# Patient Record
Sex: Female | Born: 1955
Health system: Southern US, Community
[De-identification: ages and names within clinical notes are randomized; demographics above are authoritative.]

## PROBLEM LIST (undated history)

## (undated) DIAGNOSIS — T7840XA Allergy, unspecified, initial encounter: Secondary | ICD-10-CM

## (undated) DIAGNOSIS — E785 Hyperlipidemia, unspecified: Secondary | ICD-10-CM

## (undated) DIAGNOSIS — E079 Disorder of thyroid, unspecified: Secondary | ICD-10-CM

## (undated) HISTORY — DX: Disorder of thyroid, unspecified: E07.9

## (undated) HISTORY — DX: Allergy, unspecified, initial encounter: T78.40XA

## (undated) HISTORY — PX: BACK SURGERY: SHX140

## (undated) HISTORY — PX: SPINE SURGERY: SHX786

## (undated) HISTORY — PX: TUBAL LIGATION: SHX77

## (undated) HISTORY — DX: Hyperlipidemia, unspecified: E78.5

---

## 2004-05-31 ENCOUNTER — Ambulatory Visit (HOSPITAL_COMMUNITY): Admission: RE | Admit: 2004-05-31 | Discharge: 2004-05-31 | Payer: Self-pay | Admitting: Nurse Practitioner

## 2007-08-27 ENCOUNTER — Ambulatory Visit (HOSPITAL_COMMUNITY): Admission: RE | Admit: 2007-08-27 | Discharge: 2007-08-28 | Payer: Self-pay | Admitting: Neurosurgery

## 2010-08-13 NOTE — Op Note (Signed)
NAME:  CARO, BRUNDIDGE              ACCOUNT NO.:  [id number]   MEDICAL RECORD NO.:  [id number]          PATIENT TYPE:  OIB   LOCATION:  3537                         FACILITY:  [facility]   PHYSICIAN:  Danae Orleans. Venetia Maxon, M.D.  DATE OF BIRTH:  [birth year]   DATE OF PROCEDURE:  08/27/2007  DATE OF DISCHARGE:                               OPERATIVE REPORT   PREOPERATIVE DIAGNOSES:  1. Right L5-S1 herniated lumbar disk with spondylosis.  2. Degenerative disk disease.  3. Radiculopathy.   POSTOPERATIVE DIAGNOSES:  1. Right L5-S1 herniated lumbar disk with spondylosis.  2. Degenerative disk disease.  3. Radiculopathy.   PROCEDURE:  Right L5-S1 microdiskectomy with microdissection.   SURGEON:  Danae Orleans. Venetia Maxon, MD   ASSISTANT:  Payton Doughty, MD   ANESTHESIA:  General endotracheal anesthesia.   ESTIMATED BLOOD LOSS:  Minimal.   COMPLICATIONS:  None.   DISPOSITION:  Recovery.   INDICATIONS:  Ariel Gilbert is a 55 year old woman with right L5-S1  disk herniation with significant right S1 radiculopathy.  It was elected  to take her surgery for microdiskectomy.   PROCEDURE:  Ms. Braun was brought to the operating room.  Following  satisfactory uncomplicated induction of general endotracheal anesthesia  and placement of intravenous lines, the patient was placed in a prone  position on the Wilson frame.  Her low back was prepped and draped in  the usual sterile fashion.  Area of planned incision was infiltrated  with 0.25% Marcaine and 0.5% lidocaine with 1:200,000 epinephrine.  Incision was made in the midline overlying the L5-S1 interspace.  Sharply through the right side of the posterior of the lumbodorsal  fascia, this was done sharply.  Subperiosteal dissection was performed  exposing the L5-S1 interspace.  Intraoperative x-ray confirmed the  correct orientation.  Hemi-semi-laminectomy of the L5 was performed high-  speed drill and ligamentum flavum was detached and removed in a  piecemeal fashion.  Foraminotomy was performed overlying S1 nerve root.  The ligamentum flavum was detached and removed with Kerrison rongeurs.  Using microdissection technique, the thecal sac and S1 nerve root were  identified and retracted medially exposing a large amount of herniated  disk material, which was directly compressing the S1 nerve root.  Using  suction retractor, the nerve root was mobilized.  Multiple fragments of  disk material were removed and the thecal sac and S1 nerve root were  widely decompressed.  The lateral recess was also decompressed as this  was the more medial aspect of the canal.  Endplates were not curetted,  but the disk space was cleared of residual loose disk material.  Hemostasis was assured.  The wound was irrigated, bathed in Depo-Medrol  and fentanyl.  Self-retaining retractor was removed.  Lumbodorsal fascia  was closed with 0 Vicryl sutures.  Subcutaneous tissues were  reapproximated with 2-0 Vicryl interrupted inverted sutures.  Skin edges  were reapproximated with interrupted 3-0 Vicryl subcutaneous stitch.  The wound was dressed with Dermabond.  The patient was extubated in the  operating room and taken to the recovery room in stable satisfactory  condition, having tolerated the operation  well.      Danae Orleans. Venetia Maxon, M.D.  Electronically Signed     JDS/MEDQ  D:  08/27/2007  T:  08/28/2007  Job:  [id number]

## 2010-12-25 LAB — CBC
Hemoglobin: 13.3
WBC: 7.4

## 2015-05-01 ENCOUNTER — Other Ambulatory Visit (HOSPITAL_COMMUNITY): Payer: Self-pay | Admitting: Family Medicine

## 2015-05-01 DIAGNOSIS — Z1231 Encounter for screening mammogram for malignant neoplasm of breast: Secondary | ICD-10-CM

## 2015-05-01 DIAGNOSIS — E039 Hypothyroidism, unspecified: Secondary | ICD-10-CM

## 2015-05-07 ENCOUNTER — Ambulatory Visit (HOSPITAL_COMMUNITY)
Admission: RE | Admit: 2015-05-07 | Discharge: 2015-05-07 | Disposition: A | Discharge status: Home / Self Care | Payer: BLUE CROSS/BLUE SHIELD | Source: Ambulatory Visit | Attending: Family Medicine | Admitting: Family Medicine

## 2015-05-07 DIAGNOSIS — Z1231 Encounter for screening mammogram for malignant neoplasm of breast: Secondary | ICD-10-CM | POA: Diagnosis present

## 2015-05-07 DIAGNOSIS — E039 Hypothyroidism, unspecified: Secondary | ICD-10-CM

## 2015-07-03 ENCOUNTER — Encounter: Payer: Self-pay | Admitting: Internal Medicine

## 2015-07-24 DIAGNOSIS — E782 Mixed hyperlipidemia: Secondary | ICD-10-CM | POA: Insufficient documentation

## 2015-07-24 DIAGNOSIS — E8881 Metabolic syndrome: Secondary | ICD-10-CM | POA: Insufficient documentation

## 2015-07-24 DIAGNOSIS — E063 Autoimmune thyroiditis: Principal | ICD-10-CM | POA: Insufficient documentation

## 2015-07-24 DIAGNOSIS — E038 Other specified hypothyroidism: Secondary | ICD-10-CM | POA: Insufficient documentation

## 2016-07-08 LAB — HM PAP SMEAR: HM Pap smear: NEGATIVE

## 2016-11-25 ENCOUNTER — Encounter: Payer: Self-pay | Admitting: Family Medicine

## 2016-11-25 ENCOUNTER — Ambulatory Visit (INDEPENDENT_AMBULATORY_CARE_PROVIDER_SITE_OTHER): Payer: BLUE CROSS/BLUE SHIELD | Admitting: Family Medicine

## 2016-11-25 VITALS — BP 128/86 | HR 72 | Temp 97.8°F | Resp 18 | Ht 66.25 in | Wt 167.0 lb

## 2016-11-25 DIAGNOSIS — E8881 Metabolic syndrome: Secondary | ICD-10-CM

## 2016-11-25 DIAGNOSIS — Z1211 Encounter for screening for malignant neoplasm of colon: Secondary | ICD-10-CM

## 2016-11-25 DIAGNOSIS — Z1239 Encounter for other screening for malignant neoplasm of breast: Secondary | ICD-10-CM

## 2016-11-25 DIAGNOSIS — E063 Autoimmune thyroiditis: Secondary | ICD-10-CM

## 2016-11-25 DIAGNOSIS — Z1231 Encounter for screening mammogram for malignant neoplasm of breast: Secondary | ICD-10-CM | POA: Diagnosis not present

## 2016-11-25 DIAGNOSIS — E782 Mixed hyperlipidemia: Secondary | ICD-10-CM

## 2016-11-25 DIAGNOSIS — E038 Other specified hypothyroidism: Secondary | ICD-10-CM | POA: Diagnosis not present

## 2016-11-25 NOTE — Progress Notes (Signed)
Chief Complaint  Patient presents with  . Follow-up   New patient to establish Is under care endocrinology for hypothyroidism due to hashimoto's thyroiditis. Recent thyroid labs and A1c normal per patient Also has hyperlipidemia Due for lipid panel Is careful with diet and exercise since diagnosed with metabolic syndrome.  Had elevated fasting sugar.  Now limits sweets and carbs. Last mammo 2/17.  Discussed frequency and she prefers to get them yearly PAP done last year Shots up to date per patient - although she cannot recall last tetanus.  Declines booster   Patient Active Problem List   Diagnosis Date Noted  . Mixed hyperlipidemia 07/24/2015  . Metabolic syndrome 07/24/2015  . Hypothyroidism due to Hashimoto's thyroiditis 07/24/2015    Outpatient Encounter Prescriptions as of 11/25/2016  Medication Sig  . levothyroxine (SYNTHROID) 100 MCG tablet One tablet Monday through Saturday and HALF tablet on Sunday.   No facility-administered encounter medications on file as of 11/25/2016.     Past Medical History:  Diagnosis Date  . Allergy    hay fever  . Hyperlipidemia   . Thyroid disease     Past Surgical History:  Procedure Laterality Date  . BACK SURGERY    . SPINE SURGERY     discectomy  . TUBAL LIGATION      Social History   Social History  . Marital status: Married    Spouse name: Ariel Gilbert  . Number of children: 1  . Years of education: 6   Occupational History  . customer service    Social History Main Topics  . Smoking status: Never Smoker  . Smokeless tobacco: Never Used  . Alcohol use No  . Drug use: No  . Sexual activity: Yes    Birth control/ protection: Post-menopausal   Other Topics Concern  . Not on file   Social History Narrative   Lives with husband Ariel Gilbert   Ariel Gilbert and two Ariel Gilbert   Enjoys reading, walking    Family History  Problem Relation Age of Onset  . Cancer Mother        throat  . Alcohol abuse Mother   . Heart  disease Mother   . Hyperlipidemia Mother   . Hypertension Mother   . Alcohol abuse Father   . Diabetes Father   . Heart disease Father   . Hypertension Father   . Hyperlipidemia Father   . Stroke Father 86  . Hypertension Sister   . Diabetes Brother   . Drug abuse Brother        recovering  . Alzheimer's disease Maternal Grandmother   . Cancer Maternal Grandfather   . Alcohol abuse Maternal Grandfather   . Mental illness Paternal Grandmother   . Cancer Paternal Grandfather        brain    Review of Systems  Constitutional: Negative for chills, fever and weight loss.  HENT: Negative for congestion and hearing loss.   Eyes: Negative for blurred vision and pain.  Respiratory: Negative for cough and shortness of breath.   Cardiovascular: Negative for chest pain and leg swelling.  Gastrointestinal: Negative for abdominal pain, constipation, diarrhea and heartburn.  Genitourinary: Negative for dysuria and frequency.  Musculoskeletal: Negative for falls, joint pain and myalgias.  Neurological: Negative for dizziness, seizures and headaches.  Psychiatric/Behavioral: Negative for depression. The patient is not nervous/anxious and does not have insomnia.     BP 128/86 (BP Location: Right Arm, Patient Position: Sitting, Cuff Size: Normal)   Pulse 72   Temp  97.8 F (36.6 C) (Temporal)   Resp 18   Ht 5' 6.25" (1.683 m)   Wt 167 lb (75.8 kg)   SpO2 98%   BMI 26.75 kg/m   Physical Exam  Constitutional: She is oriented to person, place, and time. She appears well-developed and well-nourished.  HENT:  Head: Normocephalic and atraumatic.  Right Ear: External ear normal.  Left Ear: External ear normal.  Mouth/Throat: Oropharynx is clear and moist.  Eyes: Pupils are equal, round, and reactive to light. Conjunctivae are normal.  Neck: Normal range of motion. Neck supple. No thyromegaly present.  Cardiovascular: Normal rate, regular rhythm and normal heart sounds.   Pulmonary/Chest:  Effort normal and breath sounds normal. No respiratory distress.  Abdominal: Soft. Bowel sounds are normal.  Musculoskeletal: Normal range of motion. She exhibits no edema.  Lymphadenopathy:    She has no cervical adenopathy.  Neurological: She is alert and oriented to person, place, and time.  Gait normal  Skin: Skin is warm and dry.  Psychiatric: She has a normal mood and affect. Her behavior is normal. Thought content normal.  Nursing note and vitals reviewed. ASSESSMENT/PLAN:   1. Hypothyroidism due to Hashimoto's thyroiditis  2. Mixed hyperlipidemia - Lipid panel  3. Metabolic syndrome - CBC - COMPLETE METABOLIC PANEL WITH GFR - VITAMIN D 25 Hydroxy (Vit-D Deficiency, Fractures) - Urinalysis, Routine w reflex microscopic  4. Screening for breast cancer ordered - MM Digital Screening; Future  5. Screen for colon cancer Never had a colonoscopy.  Agrees to cologuard - Cologuard   Patient Instructions  Need blood work Continue with the good eating habits and daily exercise I have ordered a mammogram I have ordered a cologuard  Look into getting the shingles shot  See me in Jan for a physical  Need old records Dr Neita Carp     Eustace Moore, MD

## 2016-11-25 NOTE — Patient Instructions (Signed)
Need blood work Continue with the good eating habits and daily exercise I have ordered a mammogram I have ordered a cologuard  Look into getting the shingles shot  See me in Jan for a physical  Need old records Dr Neita Carp

## 2016-11-26 ENCOUNTER — Telehealth: Payer: Self-pay | Admitting: Family Medicine

## 2016-11-26 LAB — URINALYSIS, ROUTINE W REFLEX MICROSCOPIC
BILIRUBIN URINE: NEGATIVE
GLUCOSE, UA: NEGATIVE
HGB URINE DIPSTICK: NEGATIVE
KETONES UR: NEGATIVE
Leukocytes, UA: NEGATIVE
Nitrite: NEGATIVE
PROTEIN: NEGATIVE
Specific Gravity, Urine: 1.01 (ref 1.001–1.035)
pH: 7.5 (ref 5.0–8.0)

## 2016-11-26 LAB — CBC
HCT: 40.6 % (ref 35.0–45.0)
Hemoglobin: 13.5 g/dL (ref 11.7–15.5)
MCH: 30 pg (ref 27.0–33.0)
MCHC: 33.3 g/dL (ref 32.0–36.0)
MCV: 90.2 fL (ref 80.0–100.0)
MPV: 9.7 fL (ref 7.5–12.5)
PLATELETS: 254 10*3/uL (ref 140–400)
RBC: 4.5 MIL/uL (ref 3.80–5.10)
RDW: 12.7 % (ref 11.0–15.0)
WBC: 4.9 10*3/uL (ref 3.8–10.8)

## 2016-11-26 LAB — LIPID PANEL
CHOL/HDL RATIO: 4.4 ratio (ref <5.0)
Cholesterol: 244 mg/dL — ABNORMAL HIGH (ref <200)
HDL: 55 mg/dL (ref >50)
LDL CALC: 158 mg/dL — AB (ref <100)
Triglycerides: 154 mg/dL — ABNORMAL HIGH (ref <150)
VLDL: 31 mg/dL — ABNORMAL HIGH (ref <30)

## 2016-11-26 LAB — COMPLETE METABOLIC PANEL WITH GFR
ALT: 13 U/L (ref 6–29)
AST: 16 U/L (ref 10–35)
Albumin: 4.2 g/dL (ref 3.6–5.1)
Alkaline Phosphatase: 66 U/L (ref 33–130)
BUN: 16 mg/dL (ref 7–25)
CO2: 26 mmol/L (ref 20–32)
Calcium: 8.9 mg/dL (ref 8.6–10.4)
Chloride: 103 mmol/L (ref 98–110)
Creat: 0.8 mg/dL (ref 0.50–1.05)
GFR, EST NON AFRICAN AMERICAN: 82 mL/min (ref >=60)
GFR, Est African American: >89 mL/min (ref >=60)
GLUCOSE: 97 mg/dL (ref 65–99)
POTASSIUM: 4.5 mmol/L (ref 3.5–5.3)
SODIUM: 139 mmol/L (ref 135–146)
TOTAL PROTEIN: 6.6 g/dL (ref 6.1–8.1)
Total Bilirubin: 0.3 mg/dL (ref 0.2–1.2)

## 2016-11-27 ENCOUNTER — Encounter: Payer: Self-pay | Admitting: Family Medicine

## 2016-11-27 LAB — VITAMIN D 25 HYDROXY (VIT D DEFICIENCY, FRACTURES): VIT D 25 HYDROXY: 20 ng/mL — AB (ref 30–100)

## 2016-12-03 ENCOUNTER — Ambulatory Visit (HOSPITAL_COMMUNITY)
Admission: RE | Admit: 2016-12-03 | Discharge: 2016-12-03 | Disposition: A | Discharge status: Home / Self Care | Payer: BLUE CROSS/BLUE SHIELD | Source: Ambulatory Visit | Attending: Family Medicine | Admitting: Family Medicine

## 2016-12-03 DIAGNOSIS — Z1239 Encounter for other screening for malignant neoplasm of breast: Secondary | ICD-10-CM

## 2016-12-03 DIAGNOSIS — Z1231 Encounter for screening mammogram for malignant neoplasm of breast: Secondary | ICD-10-CM | POA: Diagnosis not present

## 2017-02-17 ENCOUNTER — Other Ambulatory Visit: Payer: Self-pay

## 2017-02-17 ENCOUNTER — Ambulatory Visit (INDEPENDENT_AMBULATORY_CARE_PROVIDER_SITE_OTHER): Payer: BLUE CROSS/BLUE SHIELD | Admitting: Family Medicine

## 2017-02-17 ENCOUNTER — Encounter: Payer: Self-pay | Admitting: Family Medicine

## 2017-02-17 VITALS — BP 136/88 | HR 74 | Temp 98.7°F | Resp 16 | Ht 66.0 in | Wt 168.0 lb

## 2017-02-17 DIAGNOSIS — J341 Cyst and mucocele of nose and nasal sinus: Secondary | ICD-10-CM

## 2017-02-17 NOTE — Progress Notes (Signed)
    Chief Complaint  Patient presents with  . Referral    ENT   Patient is here requesting referral to ENT.  She went to her dentist for evaluation and had a panoramic x-ray.  This x-ray revealed a "mass".  She states that the dentist told her not to worry, but that she needed follow-up.  I called and spoke with the dentist.  He tells me that she had complained to him about sinus pressure and pain that was chronic and continuous.  When he did a panoramic view he found that she had what looked like a mucosal inclusion cyst in her maxillary sinus.  He did not see any mass or growth that had, what he considered to be malignant potential.  He does feel an ENT doctor given her sinus symptoms in the mid mucus inclusion cyst is a good idea. Ariel Gilbert tells me that her daughter works [facility] [employer].  She works in the [employer] and knows the physicians available there.  She has specifically recommended that Dr. Jacalyn LefevreJames Browne be consulted.  He is an ENT with her facility.  Patient Active Problem List   Diagnosis Date Noted  . Mixed hyperlipidemia 07/24/2015  . Metabolic syndrome 07/24/2015  . Hypothyroidism due to Hashimoto's thyroiditis 07/24/2015    Outpatient Encounter Medications as of 02/17/2017  Medication Sig  . levothyroxine (SYNTHROID) 100 MCG tablet One tablet Monday through Saturday and HALF tablet on Sunday.   No facility-administered encounter medications on file as of 02/17/2017.     No Known Allergies  Review of Systems  Constitutional: Negative for activity change, appetite change, chills and fever.  HENT: Positive for postnasal drip and sinus pressure. Negative for congestion, dental problem, rhinorrhea, sinus pain and sore throat.        Intermittent per patient  Eyes: Negative for discharge and visual disturbance.  Respiratory: Negative for cough and shortness of breath.   Cardiovascular: Negative for chest pain, palpitations and leg swelling.  Skin:       Patient very  focused on a dimple of skin present on nose and forehead that appear benign  Neurological: Negative for dizziness, facial asymmetry and headaches.     BP 136/88 (BP Location: Right Arm, Patient Position: Sitting, Cuff Size: Normal)   Pulse 74   Temp 98.7 F (37.1 C) (Temporal)   Resp 16   Ht 5\' 6"  (1.676 m)   Wt 168 lb 0.6 oz (76.2 kg)   SpO2 98%   BMI 27.12 kg/m   Physical Exam  Constitutional: She appears well-developed and well-nourished. No distress.  HENT:  Head: Normocephalic and atraumatic.  Right Ear: External ear normal.  Left Ear: External ear normal.  Nose: Nose normal.    Mouth/Throat: Oropharynx is clear and moist.  No sinus tenderness  Cardiovascular: Normal rate, regular rhythm and normal heart sounds.  Pulmonary/Chest: Effort normal and breath sounds normal.    ASSESSMENT/PLAN:  1. Maxillary sinus cyst Discussed with dentist Discussed with patient - Ambulatory referral to ENT   Patient Instructions  I have placed referral to Dr Erroll LunaBrowne Call for problems   Eustace MooreYvonne Sue Nelson, MD

## 2017-02-17 NOTE — Patient Instructions (Signed)
I have placed referral to Dr Erroll LunaBrowne Call for problems

## 2017-04-13 ENCOUNTER — Telehealth: Payer: Self-pay | Admitting: Family Medicine

## 2017-04-13 NOTE — Telephone Encounter (Signed)
Called patient to r/s her cpe per r/s list for 04/27/17

## 2017-04-27 ENCOUNTER — Encounter: Payer: BLUE CROSS/BLUE SHIELD | Admitting: Family Medicine

## 2017-06-01 ENCOUNTER — Encounter: Payer: Self-pay | Admitting: Family Medicine

## 2017-06-01 ENCOUNTER — Other Ambulatory Visit: Payer: Self-pay

## 2017-06-01 ENCOUNTER — Ambulatory Visit (INDEPENDENT_AMBULATORY_CARE_PROVIDER_SITE_OTHER): Payer: BLUE CROSS/BLUE SHIELD | Admitting: Family Medicine

## 2017-06-01 VITALS — BP 138/86 | HR 72 | Temp 98.1°F | Resp 16 | Ht 64.0 in | Wt 167.1 lb

## 2017-06-01 DIAGNOSIS — Z0000 Encounter for general adult medical examination without abnormal findings: Secondary | ICD-10-CM | POA: Diagnosis not present

## 2017-06-01 NOTE — Progress Notes (Signed)
Chief Complaint  Patient presents with  . Annual Exam   Patient is here for her annual physical examination. She had her mammogram and her colon cancer screening both done at 2018.  Her Pap is up-to-date. Immunizations are up-to-date. She has increased her walking and exercise.  She is watching the cholesterol in her diet.  She does have some moderate hyperlipidemia but her Framingham risk of developing heart disease over the next 10 years is only 7%.  She wishes not to take a statin unless she has to. She sees an endocrinologist regularly.  Recently had a TSH that was therapeutic.  She is on a stable dose of levothyroxine.  No symptoms suggestive of thyroid dysfunction, heat or cold intolerance, fatigue, weight gain, dry skin, constipation or diarrhea, palpitations.   Patient Active Problem List   Diagnosis Date Noted  . Mixed hyperlipidemia 07/24/2015  . Metabolic syndrome 07/24/2015  . Hypothyroidism due to Hashimoto's thyroiditis 07/24/2015    Outpatient Encounter Medications as of 06/01/2017  Medication Sig  . levothyroxine (SYNTHROID) 100 MCG tablet One tablet Monday through Saturday and HALF tablet on Sunday.   No facility-administered encounter medications on file as of 06/01/2017.     No Known Allergies  Review of Systems  Constitutional: Negative for activity change, appetite change and unexpected weight change.  HENT: Negative for congestion, dental problem, postnasal drip and rhinorrhea.   Eyes: Negative for redness and visual disturbance.       Gradual decline in visual acuity.  Respiratory: Negative for cough and shortness of breath.   Cardiovascular: Negative for chest pain, palpitations and leg swelling.  Gastrointestinal: Negative for abdominal pain, constipation and diarrhea.  Genitourinary: Negative for difficulty urinating, frequency and vaginal bleeding.  Musculoskeletal: Negative for arthralgias and back pain.  Neurological: Negative for dizziness and  headaches.  Psychiatric/Behavioral: Negative for dysphoric mood and sleep disturbance. The patient is not nervous/anxious.     BP 138/86   Pulse 72   Temp 98.1 F (36.7 C) (Temporal)   Resp 16   Ht 5\' 4"  (1.626 m)   Wt 167 lb 1.9 oz (75.8 kg)   SpO2 100%   BMI 28.69 kg/m   Physical Exam  BP 138/86   Pulse 72   Temp 98.1 F (36.7 C) (Temporal)   Resp 16   Ht 5\' 4"  (1.626 m)   Wt 167 lb 1.9 oz (75.8 kg)   SpO2 100%   BMI 28.69 kg/m   General Appearance:    Alert, cooperative, no distress, appears stated age  Head:    Normocephalic, without obvious abnormality, atraumatic  Eyes:    PERRL, conjunctiva/corneas clear, EOM's intact, fundi    benign, both eyes  Ears:    Normal TM's and external ear canals, both ears  Nose:   Nares normal, septum midline, mucosa normal, no drainage    or sinus tenderness  Throat:   Lips, mucosa, and tongue normal; teeth and gums normal  Neck:   Supple, symmetrical, trachea midline, no adenopathy;    thyroid:  no enlargement/tenderness/nodules; no carotid   bruit or JVD  Back:     Symmetric, no curvature, ROM normal, no CVA tenderness  Lungs:     Clear to auscultation bilaterally, respirations unlabored  Chest Wall:    No tenderness or deformity   Heart:    Regular rate and rhythm, S1 and S2 normal, no murmur, rub   or gallop  Breast Exam:    Defer GYN  Abdomen:  Soft, non-tender, bowel sounds active all four quadrants,    no masses, no organomegaly  Extremities:   Extremities normal, atraumatic, no cyanosis or edema.  Bunion deformity both feet right greater than left.  Nontender  Pulses:   2+ and symmetric all extremities  Skin:   Skin color, texture, turgor normal, no rashes or lesions  Lymph nodes:   Cervical, supraclavicular, and axillary nodes normal  Neurologic:   Nnormal strength, sensation and reflexes    throughout     ASSESSMENT/PLAN:  1. Annual physical exam No unexpected findings.   Patient Instructions  Continue to  watch the cholesterol in your diet. Walk every day that you are able. You are up-to-date with all of your health care. You are due for an eye exam.  You need to be seen yearly for an exam. Call or return sooner for any problems. We will send you a letter with follow-up information.   Eustace Moore, MD

## 2017-06-01 NOTE — Patient Instructions (Signed)
Continue to watch the cholesterol in your diet. Walk every day that you are able. You are up-to-date with all of your health care. You are due for an eye exam.  You need to be seen yearly for an exam. Call or return sooner for any problems. We will send you a letter with follow-up information.

## 2017-09-03 ENCOUNTER — Encounter: Payer: Self-pay | Admitting: Family Medicine

## 2017-09-04 ENCOUNTER — Encounter: Payer: Self-pay | Admitting: Family Medicine

## 2018-06-25 ENCOUNTER — Other Ambulatory Visit (HOSPITAL_COMMUNITY): Payer: Self-pay | Admitting: Physician Assistant

## 2018-06-25 DIAGNOSIS — E2839 Other primary ovarian failure: Secondary | ICD-10-CM

## 2020-02-03 ENCOUNTER — Other Ambulatory Visit (HOSPITAL_COMMUNITY): Payer: Self-pay | Admitting: Physician Assistant

## 2020-02-03 DIAGNOSIS — Z1231 Encounter for screening mammogram for malignant neoplasm of breast: Secondary | ICD-10-CM

## 2020-03-12 ENCOUNTER — Other Ambulatory Visit: Payer: Self-pay

## 2020-03-12 ENCOUNTER — Ambulatory Visit (HOSPITAL_COMMUNITY)
Admission: RE | Admit: 2020-03-12 | Discharge: 2020-03-12 | Disposition: A | Discharge status: Home / Self Care | Payer: BC Managed Care – PPO | Source: Ambulatory Visit | Attending: Physician Assistant | Admitting: Physician Assistant

## 2020-03-12 DIAGNOSIS — Z1231 Encounter for screening mammogram for malignant neoplasm of breast: Secondary | ICD-10-CM | POA: Diagnosis not present

## 2021-05-16 ENCOUNTER — Other Ambulatory Visit (HOSPITAL_COMMUNITY): Payer: Self-pay | Admitting: Physician Assistant

## 2021-05-16 DIAGNOSIS — Z1231 Encounter for screening mammogram for malignant neoplasm of breast: Secondary | ICD-10-CM

## 2021-05-22 ENCOUNTER — Ambulatory Visit (HOSPITAL_COMMUNITY)
Admission: RE | Admit: 2021-05-22 | Discharge: 2021-05-22 | Disposition: A | Discharge status: Home / Self Care | Payer: BC Managed Care – PPO | Source: Ambulatory Visit | Attending: Physician Assistant | Admitting: Physician Assistant

## 2021-05-22 ENCOUNTER — Other Ambulatory Visit: Payer: Self-pay

## 2021-05-22 ENCOUNTER — Other Ambulatory Visit (HOSPITAL_COMMUNITY): Payer: Self-pay | Admitting: Physician Assistant

## 2021-05-22 DIAGNOSIS — Z1231 Encounter for screening mammogram for malignant neoplasm of breast: Secondary | ICD-10-CM | POA: Diagnosis not present

## 2021-05-22 DIAGNOSIS — E2839 Other primary ovarian failure: Secondary | ICD-10-CM

## 2021-06-09 LAB — COLOGUARD: COLOGUARD: NEGATIVE

## 2021-06-10 ENCOUNTER — Ambulatory Visit (HOSPITAL_COMMUNITY)
Admission: RE | Admit: 2021-06-10 | Discharge: 2021-06-10 | Disposition: A | Discharge status: Home / Self Care | Payer: BC Managed Care – PPO | Source: Ambulatory Visit | Attending: Physician Assistant | Admitting: Physician Assistant

## 2021-06-10 ENCOUNTER — Other Ambulatory Visit: Payer: Self-pay

## 2021-06-10 DIAGNOSIS — E2839 Other primary ovarian failure: Secondary | ICD-10-CM | POA: Diagnosis present

## 2022-03-17 IMAGING — MG DIGITAL SCREENING BILAT W/ TOMO W/ CAD
8 series · 8 of 24 positions shown · non-contrast
Comparison: Previous exam(s).

CLINICAL DATA: Screening.

EXAM:
DIGITAL SCREENING BILATERAL MAMMOGRAM WITH TOMO AND CAD

[R CC synth-2D]
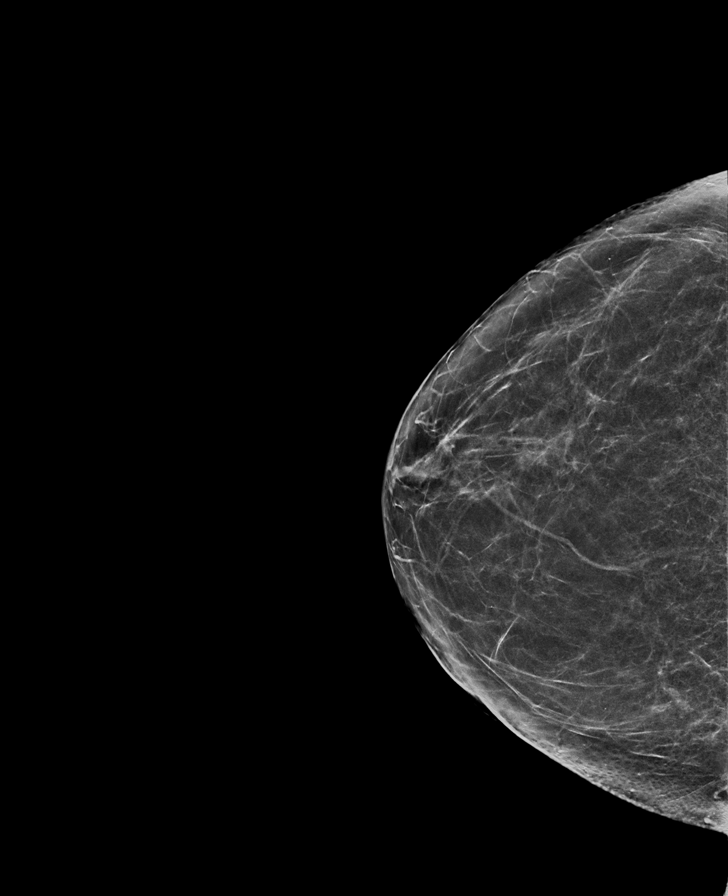

[L CC synth-2D]
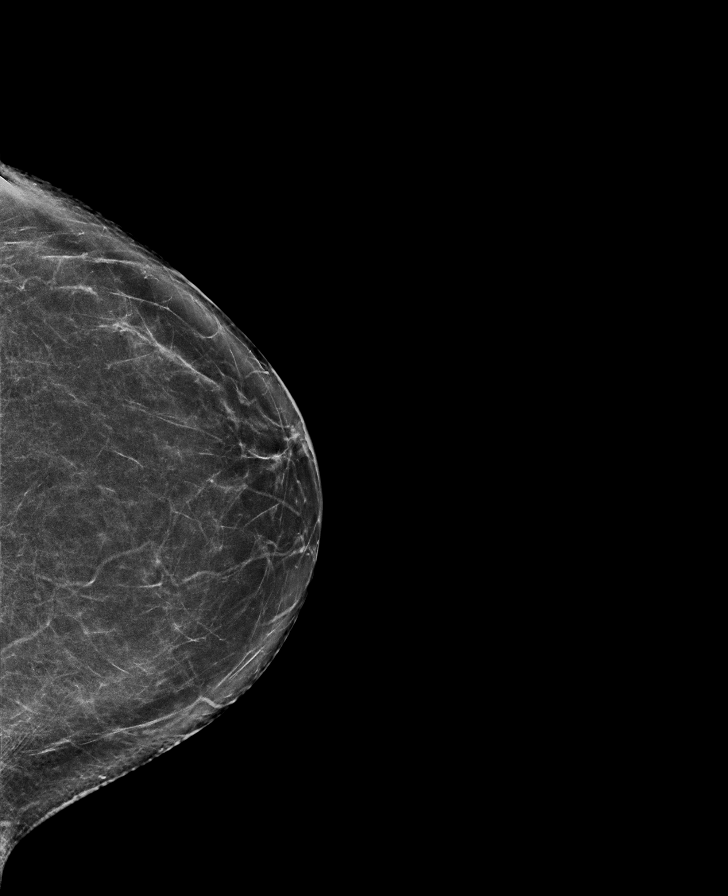

[R MLO synth-2D]
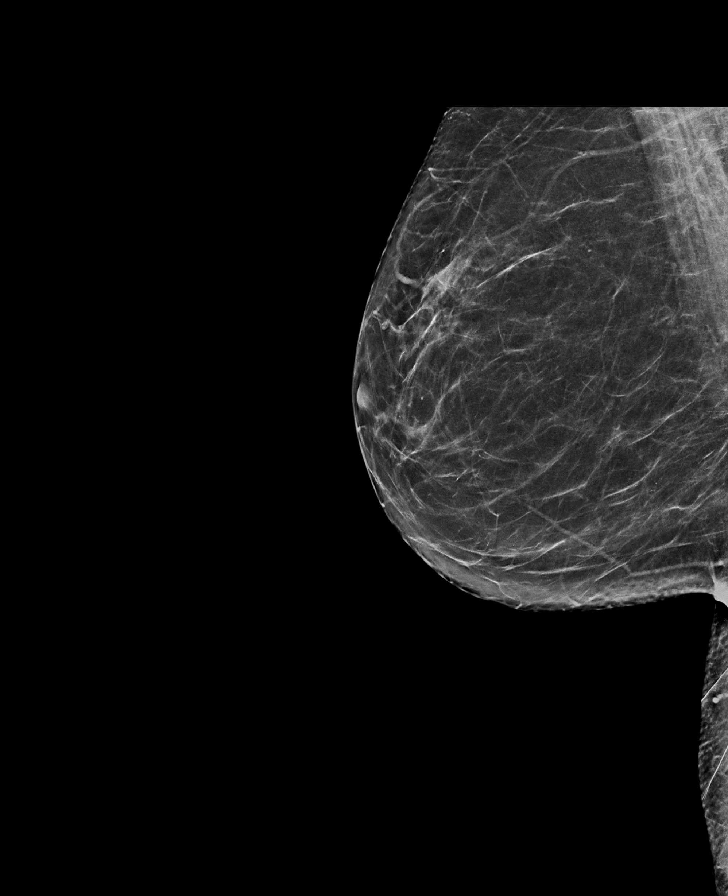

[L MLO synth-2D]
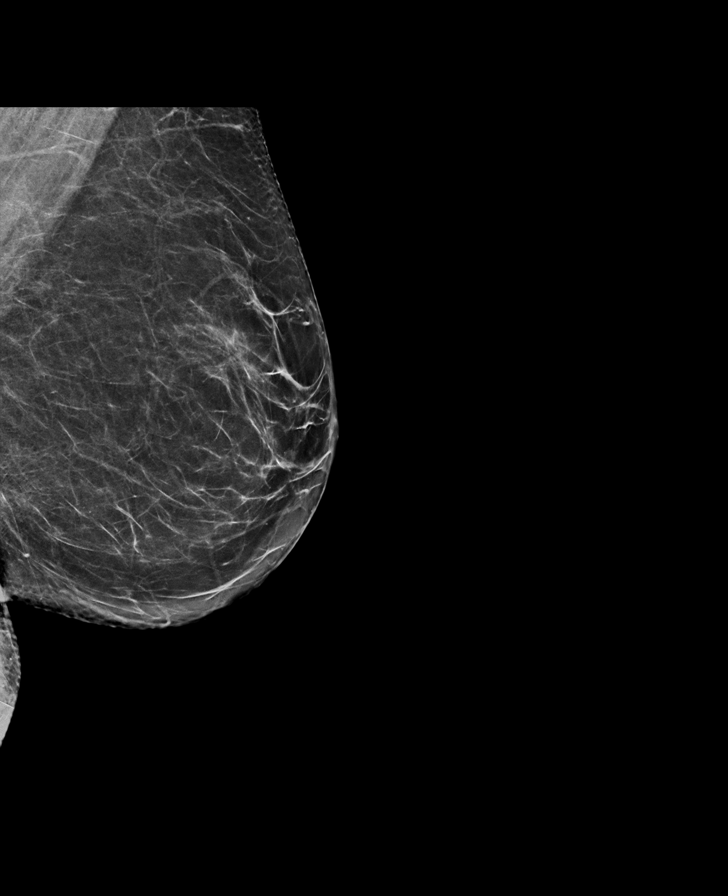

[R MLO tomo · tomo slice 35/68.0]
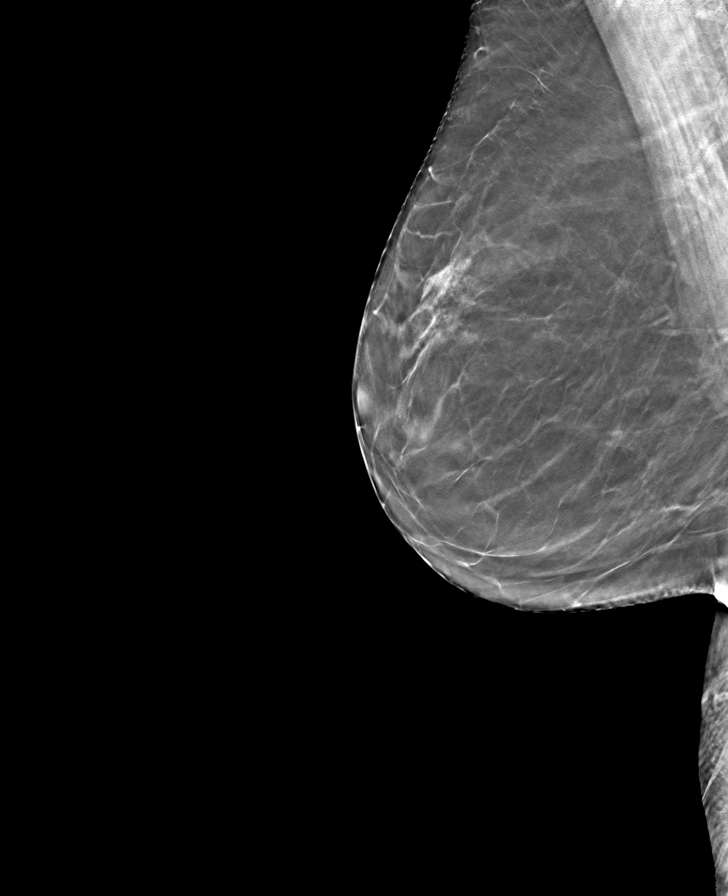

[R CC tomo · tomo slice 33/65.0]
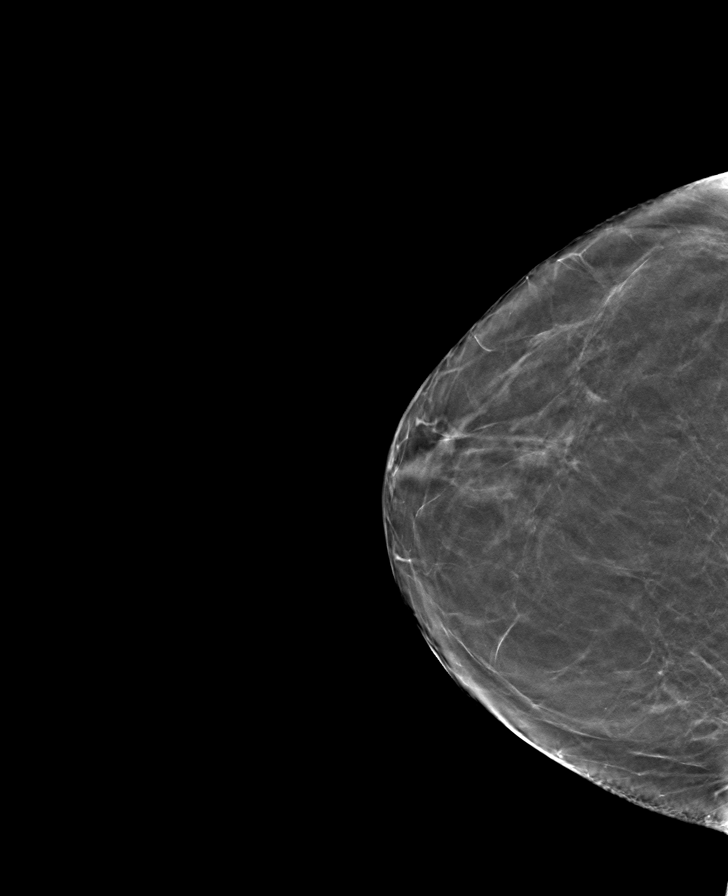

[L CC tomo · tomo slice 37/72.0]
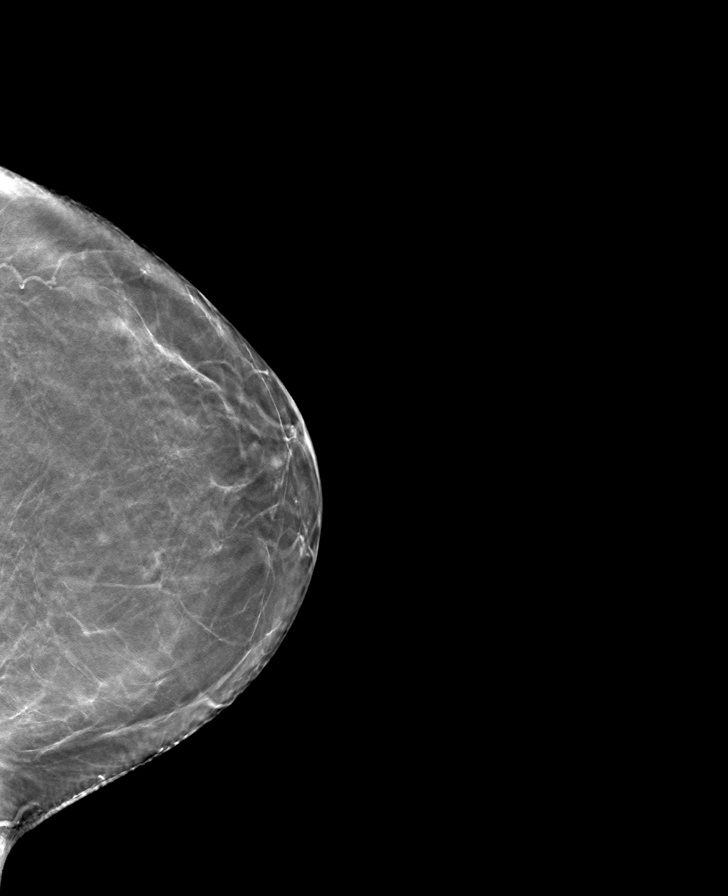

[L MLO tomo · tomo slice 37/74.0]
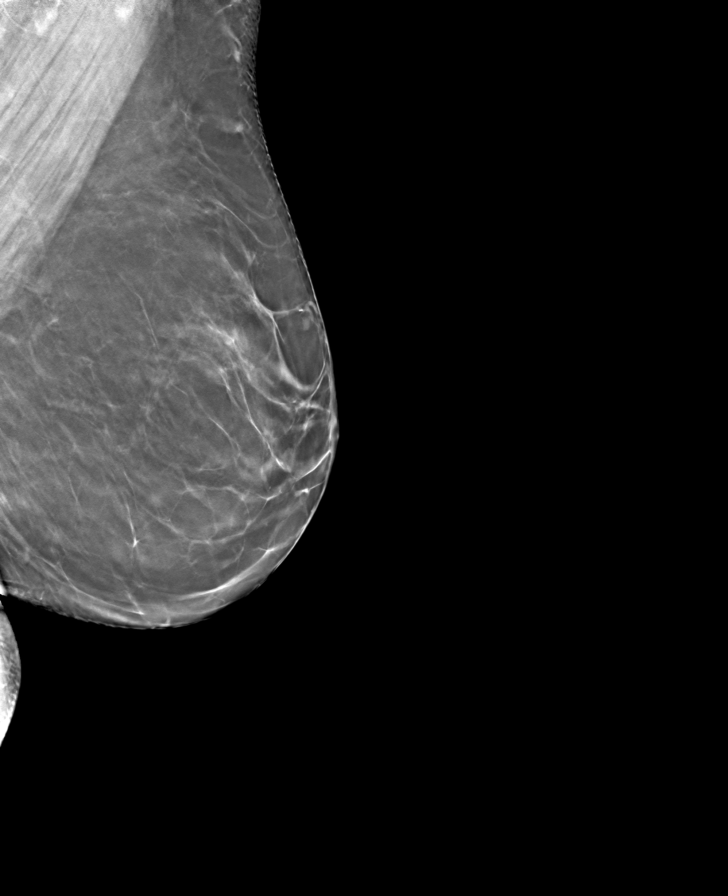

[8 of 24 positions shown; findings below may reference images not displayed]

ACR Breast Density Category b: There are scattered areas of
fibroglandular density.
FINDINGS: There are no findings suspicious for malignancy. Images were
processed with CAD.
IMPRESSION: No mammographic evidence of malignancy. A result letter of this
screening mammogram will be mailed directly to the patient.

RECOMMENDATION:
Screening mammogram in one year. (Code:[id number]-775)

BI-RADS CATEGORY  1: Negative.

## 2022-10-29 ENCOUNTER — Encounter: Payer: Self-pay | Admitting: Internal Medicine

## 2022-10-29 ENCOUNTER — Ambulatory Visit: Payer: BC Managed Care – PPO | Attending: Internal Medicine | Admitting: Internal Medicine

## 2022-10-29 VITALS — BP 110/70 | HR 68 | Ht 67.0 in | Wt 176.4 lb

## 2022-10-29 DIAGNOSIS — Z8249 Family history of ischemic heart disease and other diseases of the circulatory system: Secondary | ICD-10-CM | POA: Diagnosis not present

## 2022-10-29 NOTE — Patient Instructions (Addendum)
Medication Instructions:  Your physician recommends that you continue on your current medications as directed. Please refer to the Current Medication list given to you today.   Labwork: Lipoprotein-a to be done at [facility] within a week   Testing/Procedures: Your physician has requested that you have cardiac CT. Cardiac computed tomography (CT) is a painless test that uses an x-ray machine to take clear, detailed pictures of your heart. For further information please visit [email or web address] Please follow instruction sheet as given.    Follow-Up: Your physician recommends that you schedule a follow-up appointment in: As needed  Any Other Special Instructions Will Be Listed Below (If Applicable).  If you need a refill on your cardiac medications before your next appointment, please call your pharmacy.

## 2022-11-12 NOTE — Progress Notes (Unsigned)
Cardiology Office Note  Date: 11/12/2022   ID: Ariel Gilbert, DOB 1955/04/21, MRN [id number]  PCP:  Estanislado Pandy, MD  Cardiologist:  Marjo Bicker, MD Electrophysiologist:  None   History of Present Illness: Ariel Gilbert is a 67 y.o. female  Past Medical History:  Diagnosis Date   Allergy    hay fever   Hyperlipidemia    Thyroid disease     Past Surgical History:  Procedure Laterality Date   BACK SURGERY     SPINE SURGERY     discectomy   TUBAL LIGATION      Current Outpatient Medications  Medication Sig Dispense Refill   levothyroxine (SYNTHROID) 100 MCG tablet One tablet Monday through Saturday and HALF tablet on Sunday.     No current facility-administered medications for this visit.   Allergies:  Patient has no known allergies.   Social History: The patient  reports that she has never smoked. She has never used smokeless tobacco. She reports that she does not drink alcohol and does not use drugs.   Family History: The patient's family history includes Alcohol abuse in her father, maternal grandfather, and mother; Alzheimer's disease in her maternal grandmother; Cancer in her maternal grandfather, mother, and paternal grandfather; Diabetes in her brother and father; Drug abuse in her brother; Heart attack in her brother, brother, father, and sister; Heart disease in her father and mother; Hyperlipidemia in her father and mother; Hypertension in her father, mother, and sister; Mental illness in her paternal grandmother; Peripheral Artery Disease in her sister; Stroke (age of onset: 54) in her father.   ROS:  Please see the history of present illness. Otherwise, complete review of systems is positive for none  All other systems are reviewed and negative.   Physical Exam: VS:  BP 110/70   Pulse 68   Ht 5\' 7"  (1.702 m)   Wt 176 lb 6.4 oz (80 kg)   SpO2 100%   BMI 27.63 kg/m , BMI Body mass index is 27.63 kg/m.  Wt Readings from Last 3 Encounters:   10/29/22 176 lb 6.4 oz (80 kg)  06/01/17 167 lb 1.9 oz (75.8 kg)  02/17/17 168 lb 0.6 oz (76.2 kg)    General: Patient appears comfortable at rest. HEENT: Conjunctiva and lids normal, oropharynx clear with moist mucosa. Neck: Supple, no elevated JVP or carotid bruits, no thyromegaly. Lungs: Clear to auscultation, nonlabored breathing at rest. Cardiac: Regular rate and rhythm, no S3 or significant systolic murmur, no pericardial rub. Abdomen: Soft, nontender, no hepatomegaly, bowel sounds present, no guarding or rebound. Extremities: No pitting edema, distal pulses 2+. Skin: Warm and dry. Musculoskeletal: No kyphosis. Neuropsychiatric: Alert and oriented x3, affect grossly appropriate.  Recent Labwork: No results found for requested labs within last 365 days.     Component Value Date/Time   CHOL 244 (H) 11/25/2016 0714   TRIG 154 (H) 11/25/2016 0714   HDL 55 11/25/2016 0714   CHOLHDL 4.4 11/25/2016 0714   VLDL 31 (H) 11/25/2016 0714   LDLCALC 158 (H) 11/25/2016 8295    Other Studies Reviewed Today:   Assessment and Plan:         Medication Adjustments/Labs and Tests Ordered: Current medicines are reviewed at length with the patient today.  Concerns regarding medicines are outlined above.    Disposition:  Follow up {follow up:15908}  Signed Aniyah Nobis Verne Spurr, MD, 11/12/2022 9:50 AM    [facility] [address]  [place], [place] [address]

## 2022-11-13 DIAGNOSIS — Z8249 Family history of ischemic heart disease and other diseases of the circulatory system: Secondary | ICD-10-CM | POA: Insufficient documentation

## 2022-12-12 ENCOUNTER — Ambulatory Visit (HOSPITAL_COMMUNITY)
Admission: RE | Admit: 2022-12-12 | Discharge: 2022-12-12 | Disposition: A | Discharge status: Home / Self Care | Payer: BC Managed Care – PPO | Source: Ambulatory Visit | Attending: Internal Medicine

## 2022-12-12 DIAGNOSIS — Z8249 Family history of ischemic heart disease and other diseases of the circulatory system: Secondary | ICD-10-CM | POA: Insufficient documentation

## 2024-01-27 ENCOUNTER — Ambulatory Visit: Attending: Internal Medicine | Admitting: Internal Medicine

## 2024-01-27 ENCOUNTER — Encounter: Payer: Self-pay | Admitting: Internal Medicine

## 2024-01-27 ENCOUNTER — Other Ambulatory Visit (HOSPITAL_COMMUNITY)
Admission: RE | Admit: 2024-01-27 | Discharge: 2024-01-27 | Disposition: A | Discharge status: Home / Self Care | Source: Ambulatory Visit | Attending: Internal Medicine | Admitting: Internal Medicine

## 2024-01-27 VITALS — BP 122/70 | HR 67 | Ht 67.0 in | Wt 173.6 lb

## 2024-01-27 DIAGNOSIS — R931 Abnormal findings on diagnostic imaging of heart and coronary circulation: Secondary | ICD-10-CM | POA: Diagnosis not present

## 2024-01-27 DIAGNOSIS — Z8249 Family history of ischemic heart disease and other diseases of the circulatory system: Secondary | ICD-10-CM | POA: Insufficient documentation

## 2024-01-27 DIAGNOSIS — E785 Hyperlipidemia, unspecified: Secondary | ICD-10-CM | POA: Insufficient documentation

## 2024-01-27 NOTE — Progress Notes (Signed)
 Cardiology Office Note  Date: 01/27/2024   ID: Ariel Gilbert, DOB [birth year], MRN [id number]  PCP:  Job Bolt, PA  Cardiologist:  Diannah SHAUNNA Maywood, MD Electrophysiologist:  None   History of Present Illness: Ariel Gilbert is a 68 y.o. female known to have HLD was sent back to cardiology clinic for repeating CT coronary calcium scoring.  CT coronary calcium scoring was performed in September 2024.  Coronary calcium score is 1.31, 55th percentile for age and sex matched control.  She works out almost daily.  She already got 7000 steps for today.  Does not have any symptoms of angina or DOE.  She maintains an active healthy lifestyle.  Compliant with healthy diet.  No other symptoms of dizziness, syncope, palpitations.  She has a family history of premature CAD.  Her father had CABG in his early 44s.  Her mother had a presumed heart attack and probably might have received a stent in her early 25s.  Past Medical History:  Diagnosis Date   Allergy    hay fever   Hyperlipidemia    Thyroid  disease     Past Surgical History:  Procedure Laterality Date   BACK SURGERY     SPINE SURGERY     discectomy   TUBAL LIGATION      Current Outpatient Medications  Medication Sig Dispense Refill   cholecalciferol (VITAMIN D3) 25 MCG (1000 UNIT) tablet Take 2,000 Units by mouth daily.     levothyroxine (SYNTHROID) 100 MCG tablet One tablet Monday through Saturday and HALF tablet on Sunday.     No current facility-administered medications for this visit.   Allergies:  Patient has no known allergies.   Social History: The patient  reports that she has never smoked. She has never used smokeless tobacco. She reports that she does not drink alcohol and does not use drugs.   Family History: The patient's family history includes Alcohol abuse in her father, maternal grandfather, and mother; Alzheimer's disease in her maternal grandmother; Cancer in her maternal grandfather,  mother, and paternal grandfather; Diabetes in her brother and father; Drug abuse in her brother; Heart attack in her brother, brother, father, and sister; Heart disease in her father and mother; Hyperlipidemia in her father and mother; Hypertension in her father, mother, and sister; Mental illness in her paternal grandmother; Peripheral Artery Disease in her sister; Stroke (age of onset: 61) in her father.   ROS:  Please see the history of present illness. Otherwise, complete review of systems is positive for none  All other systems are reviewed and negative.   Physical Exam: VS:  Ht 5' 7 (1.702 m)   Wt 173 lb 9.6 oz (78.7 kg)   BMI 27.19 kg/m , BMI Body mass index is 27.19 kg/m.  Wt Readings from Last 3 Encounters:  01/27/24 173 lb 9.6 oz (78.7 kg)  10/29/22 176 lb 6.4 oz (80 kg)  06/01/17 167 lb 1.9 oz (75.8 kg)    General: Patient appears comfortable at rest. HEENT: Conjunctiva and lids normal, oropharynx clear with moist mucosa. Neck: Supple, no elevated JVP or carotid bruits, no thyromegaly. Lungs: Clear to auscultation, nonlabored breathing at rest. Cardiac: Regular rate and rhythm, no S3 or significant systolic murmur, no pericardial rub. Abdomen: Soft, nontender, no hepatomegaly, bowel sounds present, no guarding or rebound. Extremities: No pitting edema, distal pulses 2+. Skin: Warm and dry. Musculoskeletal: No kyphosis. Neuropsychiatric: Alert and oriented x3, affect grossly appropriate.  Recent Labwork: No results found for  requested labs within last 365 days.     Component Value Date/Time   CHOL 244 (H) 11/25/2016 0714   TRIG 154 (H) 11/25/2016 0714   HDL 55 11/25/2016 0714   CHOLHDL 4.4 11/25/2016 0714   VLDL 31 (H) 11/25/2016 0714   LDLCALC 158 (H) 11/25/2016 0714    Assessment and Plan:  Family history of premature CAD Hyperlipidemia, not at goal Coronary calcium score 1.31 - Patient has an extensive family history of premature CAD.  Father had CABG in his  early 10s.  Mother had presumed heart attack in early 13s and probably had a stent according to the patient.  LPA levels were ordered in the last clinic visit, patient did not get this done.  Will repeat.  If LPA levels are elevated, she will need PCSK9 inhibitors or inclisiran. - I reviewed the lipid panel done at the PCPs office in 2025.  LDL was 200.  It was always 200 or more than 200.  She refused a statin.  I had an extensive discussion today, she continues to refuse statin.  I discussed about initiating PCSK9 inhibitors or inclisiran.  She wants to do her research and then reach out to our office if she decides to move forward with initiating the injectables.  She is not interested to undergo genetic testing for familial hypercholesterolemia at this time.  Her daughter has normal lipid panel according to her. - I reviewed and discussed CT coronary calcium scoring results with the patient today.  No indication to repeat CT coronary calcium scoring until after 5 or 10 years. - Discussed heart healthy diet and exercise recommendations.  30 minutes spent in reviewing the prior medical records, more than the labs, reports, discussion of the above with the patient and documentation.  Medication Adjustments/Labs and Tests Ordered: Current medicines are reviewed at length with the patient today.  Concerns regarding medicines are outlined above.    Disposition:  Follow up prn  Signed Ariel Gilbert [name], MD, 01/27/2024 8:51 AM    [facility] [address] [place], [place], [place] [address]

## 2024-01-27 NOTE — Patient Instructions (Addendum)
 Medication Instructions:   Your physician recommends that you continue on your current medications as directed. Please refer to the Current Medication list given to you today.   Labwork: LP(a)  Testing/Procedures: None today  Follow-Up: As needed  Any Other Special Instructions Will Be Listed Below (If Applicable).  PCSK9 inhibitors (Repatha, Praluent)  Inclisiran Garr)  If you need a refill on your cardiac medications before your next appointment, please call your pharmacy.

## 2024-01-29 LAB — LIPOPROTEIN A (LPA): Lipoprotein (a): 219.8 nmol/L — ABNORMAL HIGH (ref <75.0)

## 2024-02-11 ENCOUNTER — Ambulatory Visit: Payer: Self-pay | Admitting: Internal Medicine
